# Patient Record
Sex: Male | Born: 1985 | Hispanic: No | Marital: Single | Smoking: Never smoker
Health system: Southern US, Community
[De-identification: ages and names within clinical notes are randomized; demographics above are authoritative.]

---

## 2008-02-05 ENCOUNTER — Emergency Department (HOSPITAL_COMMUNITY): Admission: EM | Admit: 2008-02-05 | Discharge: 2008-02-05 | Payer: Self-pay | Admitting: Emergency Medicine

## 2013-04-18 ENCOUNTER — Ambulatory Visit (HOSPITAL_COMMUNITY)
Admission: RE | Admit: 2013-04-18 | Discharge: 2013-04-18 | Disposition: A | Discharge status: Home / Self Care | Payer: BC Managed Care – PPO | Source: Ambulatory Visit | Attending: Specialist | Admitting: Specialist

## 2013-04-18 ENCOUNTER — Other Ambulatory Visit (HOSPITAL_COMMUNITY): Payer: Self-pay | Admitting: Specialist

## 2013-04-18 DIAGNOSIS — M25511 Pain in right shoulder: Secondary | ICD-10-CM

## 2013-04-18 DIAGNOSIS — Z1389 Encounter for screening for other disorder: Secondary | ICD-10-CM | POA: Insufficient documentation

## 2014-06-23 IMAGING — CR DG ORBITS FOR FOREIGN BODY
2 series · 2 of 2 positions shown · non-contrast
Comparison: None.

CLINICAL DATA: Metal working/exposure; clearance prior to MRI

EXAM:
ORBITS FOR FOREIGN BODY - 2 VIEW

[w waters (1 of 2)]
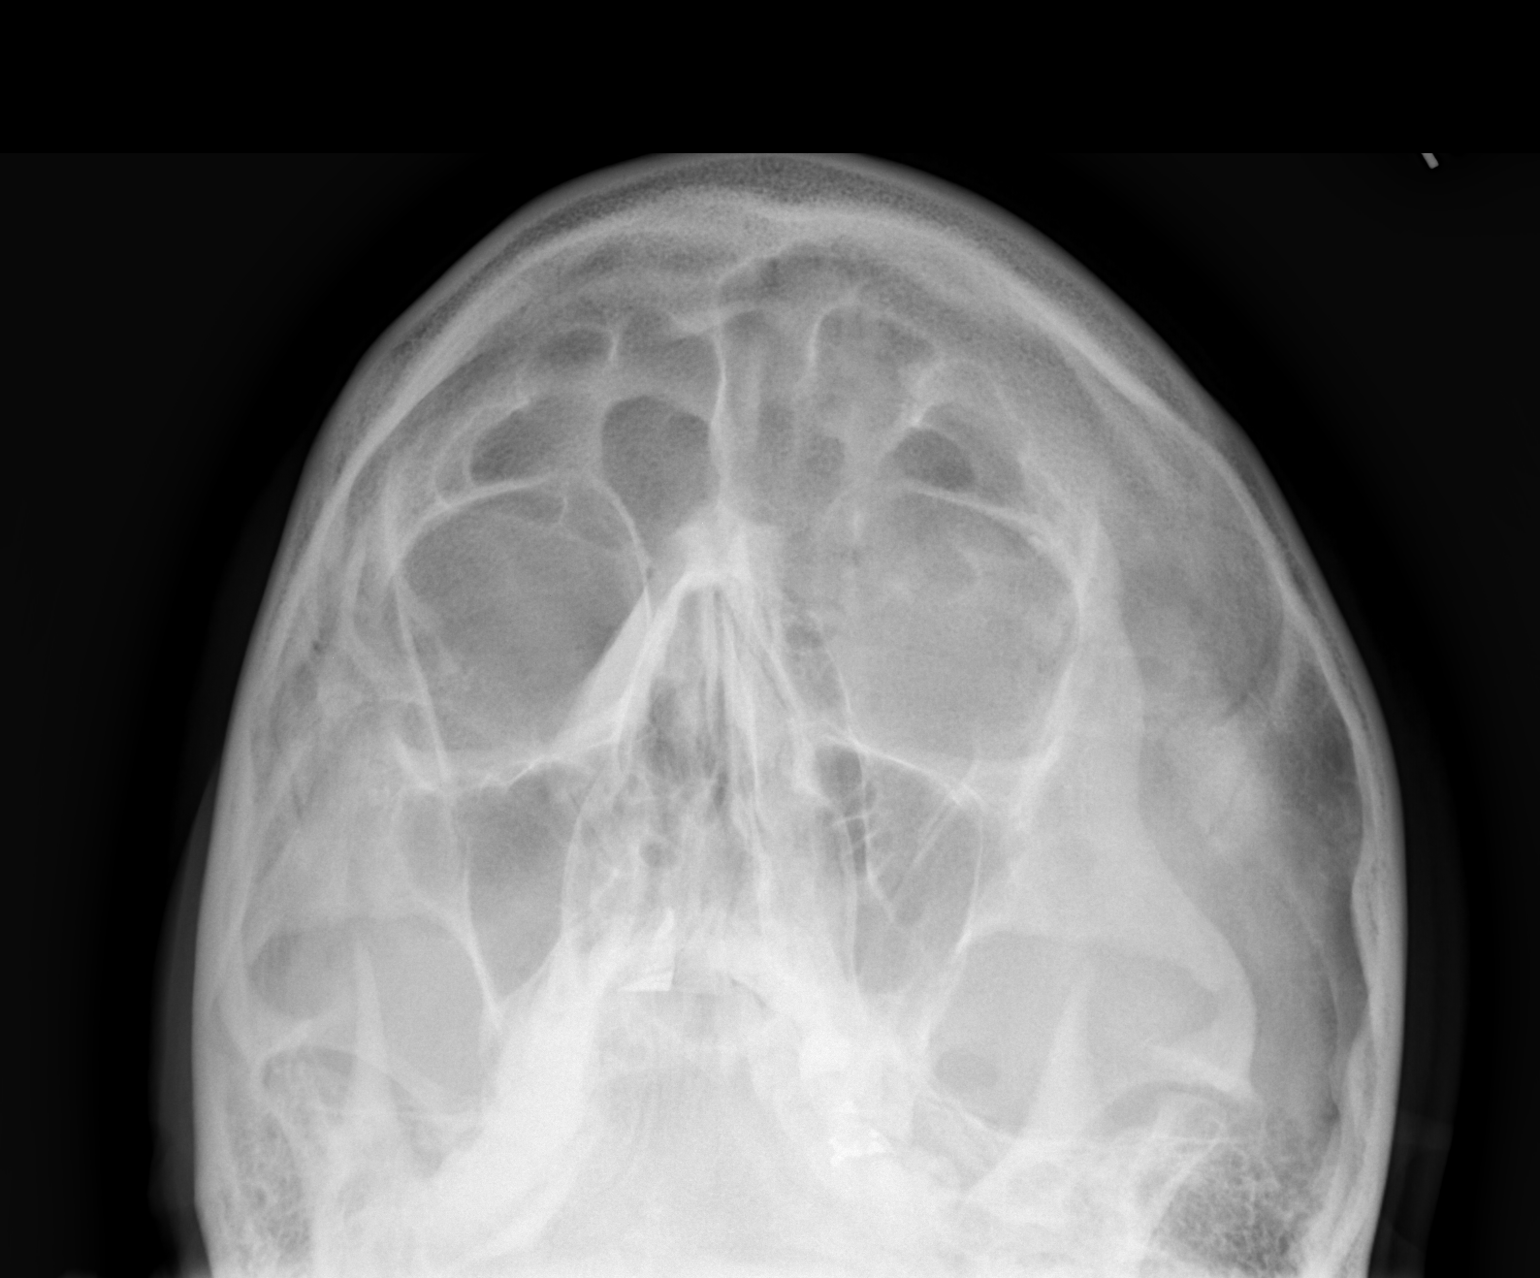

[w waters (2 of 2)]
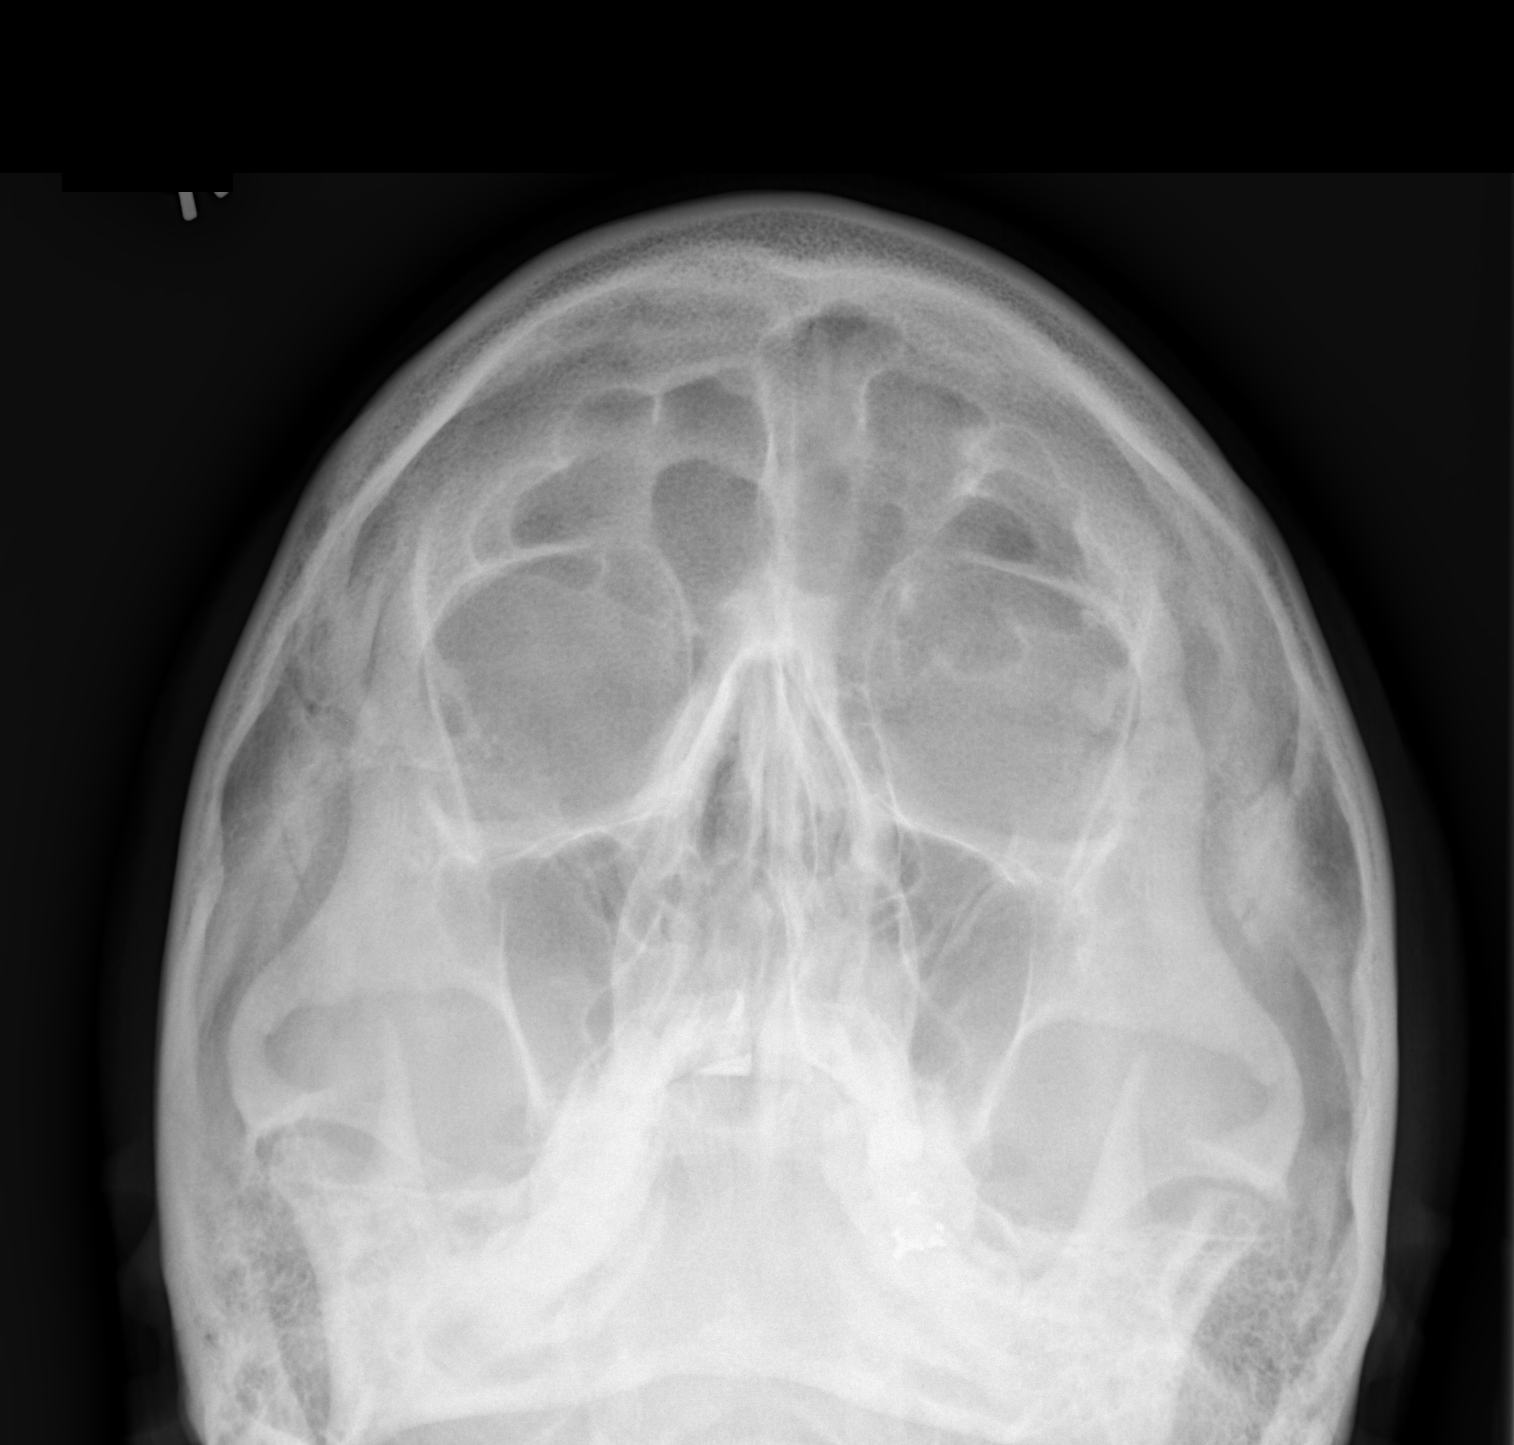

[2 of 2 positions shown; findings below may reference images not displayed]

FINDINGS: There is no evidence of metallic foreign body within the orbits. No
significant bone abnormality identified.
IMPRESSION: No evidence of metallic foreign body within the orbits.

## 2022-06-30 ENCOUNTER — Encounter: Payer: Self-pay | Admitting: Urology

## 2022-06-30 ENCOUNTER — Ambulatory Visit (INDEPENDENT_AMBULATORY_CARE_PROVIDER_SITE_OTHER): Payer: Commercial Indemnity | Admitting: Urology

## 2022-06-30 VITALS — BP 124/76 | HR 73 | Ht 70.0 in | Wt 171.0 lb

## 2022-06-30 DIAGNOSIS — N5089 Other specified disorders of the male genital organs: Secondary | ICD-10-CM

## 2022-06-30 DIAGNOSIS — N50819 Testicular pain, unspecified: Secondary | ICD-10-CM

## 2022-06-30 LAB — URINALYSIS, COMPLETE
Bilirubin, UA: NEGATIVE
Glucose, UA: NEGATIVE
Ketones, UA: NEGATIVE
Leukocytes,UA: NEGATIVE
Nitrite, UA: NEGATIVE
Protein,UA: NEGATIVE
RBC, UA: NEGATIVE
Specific Gravity, UA: 1.02 (ref 1.005–1.030)
Urobilinogen, Ur: 0.2 mg/dL (ref 0.2–1.0)
pH, UA: 5 (ref 5.0–7.5)

## 2022-06-30 LAB — MICROSCOPIC EXAMINATION: Bacteria, UA: NONE SEEN

## 2022-06-30 NOTE — Progress Notes (Unsigned)
06/30/2022 11:39 AM   Drew Allen 1985-11-09 [id number]  Referring provider: No referring provider defined for this encounter.  Chief Complaint  Patient presents with   Groin Pain    HPI: Drew Allen is a 36 y.o. male self-referred for evaluation of scrotal masses.  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  Saw a urologist several years ago and was told finding was benign.  No ultrasound performed Recently noted a new area on the posterior aspect of the left testis No pain No bothersome LUTS   PMH: No past medical history on file.  Surgical History: *** The histories are not reviewed yet. Please review them in the "History" navigator section and refresh this SmartLink.  Home Medications:  Allergies as of 06/30/2022       Reactions   Prednisone Other (See Comments)   Both knees swelled up  Both knees swelled up Both knees swelled up        Medication List    as of June 30, 2022 11:39 AM   You have not been prescribed any medications.     Allergies:  Allergies  Allergen Reactions   Prednisone Other (See Comments)    Both knees swelled up  Both knees swelled up  Both knees swelled up    Family History: No family history on file.  Social History:  reports that he has never smoked. He has never used smokeless tobacco. He reports current alcohol use. No history on file for drug use.   Physical Exam: BP 124/76   Pulse 73   Ht 5\' 10"  (1.778 m)   Wt 171 lb (77.6 kg)   BMI 24.54 kg/m   Constitutional:  Alert and oriented, No acute distress. HEENT:  AT Cardiovascular: No clubbing, cyanosis, or edema. Respiratory: Normal respiratory effort, no increased work of breathing. 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  Testes descended bilaterally masses or tenderness.  On the anterior aspect of the left upper testis is a spherical, smooth 4 mm mass.  On the posterior aspect in the region of the left epididymis is a slightly firm, spherical  2-3 mm mass Psychiatric: Normal mood and affect.  Laboratory Data: No results found for: "WBC", "HGB", "HCT", "MCV", "PLT"  No results found for: "CREATININE"  No results found for: "PSA"  No results found for: "TESTOSTERONE"  No results found for: "HGBA1C"  Urinalysis No results found for: "COLORURINE", "APPEARANCEUR", "LABSPEC", "PHURINE", "GLUCOSEU", "HGBUR", "BILIRUBINUR", "KETONESUR", "PROTEINUR", "UROBILINOGEN", "NITRITE", "LEUKOCYTESUR"  No results found for: "LABMICR", "WBCUA", "RBCUA", "LABEPIT", "MUCUS", "BACTERIA"  Pertinent Imaging: *** No results found for this or any previous visit.  No results found for this or any previous visit.  No results found for this or any previous visit.  No results found for this or any previous visit.  No results found for this or any previous visit.  No valid procedures specified. No results found for this or any previous visit.  No results found for this or any previous visit.   Assessment & Plan:    1.  Left scrotal masses The anterior mass may be a appendix testis or epididymis; possible small spermatocele The posterior mass may be a cyst or benign calcification Reassured there is no suspicion of cancer I offered to schedule a scrotal ultrasound for further evaluation and he would like to proceed Order was entered and will call with results ***Scrotal ultrasound-no Doppler   , MD  [facility] [address], [address] [place],  [place] [address] [phone]

## 2022-07-06 ENCOUNTER — Ambulatory Visit
Admission: RE | Admit: 2022-07-06 | Discharge: 2022-07-06 | Disposition: A | Discharge status: Home / Self Care | Payer: Commercial Managed Care - HMO | Source: Ambulatory Visit | Attending: Urology | Admitting: Urology

## 2022-07-06 DIAGNOSIS — N5089 Other specified disorders of the male genital organs: Secondary | ICD-10-CM | POA: Diagnosis present

## 2022-07-07 ENCOUNTER — Telehealth: Payer: Self-pay | Admitting: *Deleted

## 2022-07-07 NOTE — Telephone Encounter (Signed)
Pt returned call and I read message from [place] 

## 2022-07-07 NOTE — Telephone Encounter (Signed)
-----   Message from Riki Altes, MD sent at 07/07/2022  7:28 AM EST ----- Scrotal ultrasound showed no testicular masses or anything suspicious for cancer.  Small cysts are noted on the left and right epididymis and are benign.  No treatment is needed.  Okay to follow-up as needed
